# Patient Record
Sex: Male | Born: 1955 | Race: White | Hispanic: No | Marital: Married | Smoking: Never smoker
Health system: Southern US, Community
[De-identification: ages and names within clinical notes are randomized; demographics above are authoritative.]

---

## 2019-01-07 ENCOUNTER — Other Ambulatory Visit: Payer: Self-pay | Admitting: Urology

## 2019-01-07 DIAGNOSIS — C61 Malignant neoplasm of prostate: Secondary | ICD-10-CM

## 2019-03-02 ENCOUNTER — Other Ambulatory Visit: Payer: Self-pay

## 2019-03-02 ENCOUNTER — Ambulatory Visit
Admission: RE | Admit: 2019-03-02 | Discharge: 2019-03-02 | Disposition: A | Discharge status: Home / Self Care | Payer: No Typology Code available for payment source | Source: Ambulatory Visit | Attending: Urology | Admitting: Urology

## 2019-03-02 DIAGNOSIS — C61 Malignant neoplasm of prostate: Secondary | ICD-10-CM

## 2019-03-02 MED ORDER — GADOBENATE DIMEGLUMINE 529 MG/ML IV SOLN
20.0000 mL | Freq: Once | INTRAVENOUS | Status: AC | PRN
  Administered 2019-03-02: 20 mL via INTRAVENOUS

## 2019-03-10 ENCOUNTER — Other Ambulatory Visit: Payer: Self-pay

## 2021-05-29 IMAGING — MR MR PROSTATE WO/W CM
56 series · 56 of 56 positions shown · IV contrast (20 ml multihance)
Comparison: None.

CLINICAL DATA: Recent biopsy demonstrating carcinoma.

EXAM:
MR PROSTATE WITHOUT AND WITH CONTRAST
TECHNIQUE: Multiplanar multisequence MRI images were obtained of the pelvis
centered about the prostate. Pre and post contrast images were
obtained.
CONTRAST:  20mL MULTIHANCE GADOBENATE DIMEGLUMINE 529 MG/ML IV SOLN

[Series 3: T1 · axial · 8.0mm · 1.06mm/px · 1 of 28 slices shown (1 of 2)]
[im 1/28]
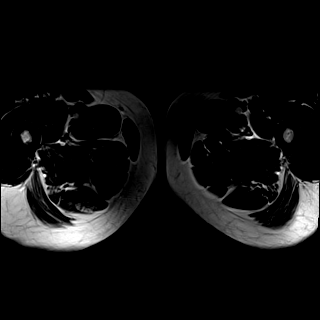

[Series 4: bSSFP fat-sat · axial · 8.0mm · 0.74mm/px · 1 of 28 slices shown]
[im 1/28]
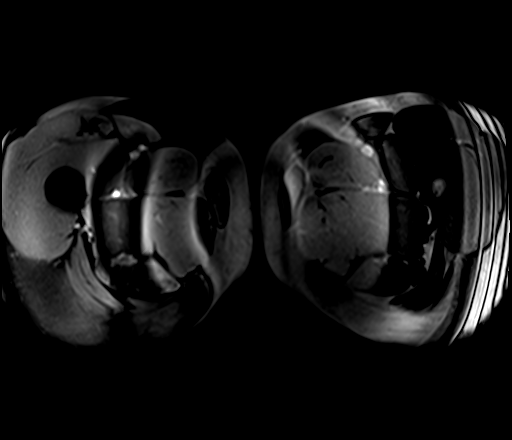

[Series 5: T2 · sagittal · 3.5mm · 0.77mm/px · 1 of 48 slices shown (1 of 4)]
[im 1/48]
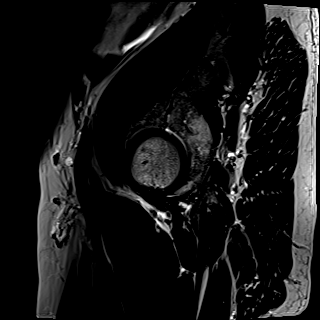

[Series 6: T1 · axial · 3.0mm · 0.31mm/px · 1 of 25 slices shown (2 of 2)]
[im 1/25]
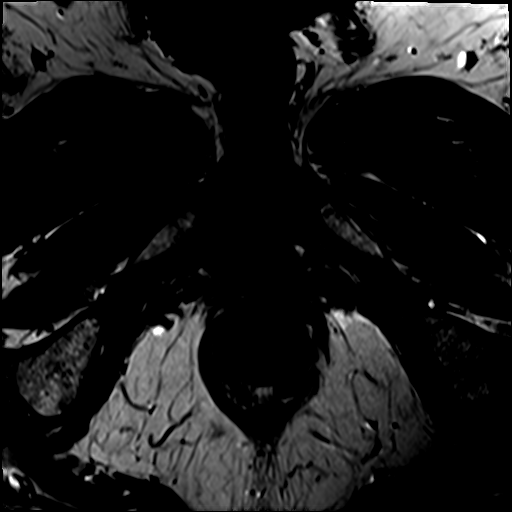

[Series 7: T2 · axial · 3.5mm · 0.56mm/px · 1 of 25 slices shown (2 of 4)]
[im 1/25]
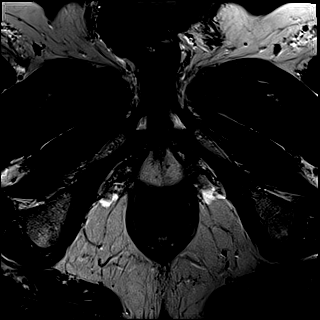

[Series 8: T2 · axial · 1.0mm · 1.04mm/px · 1 of 80 slices shown (3 of 4)]
[im 1/80]
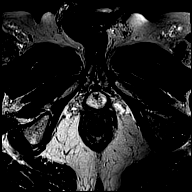

[Series 9: T2 · coronal · 3.5mm · 0.56mm/px · 1 of 23 slices shown (4 of 4)]
[im 1/23]
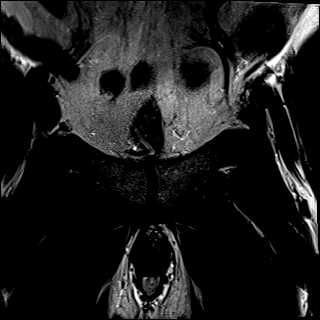

[Series 10: DWI · axial · 3.5mm · 1.56mm/px · 1 of 75 slices shown (1 of 2)]
[im 1/75]
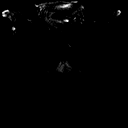

[Series 11: DWI · axial · 3.5mm · 1.56mm/px · 1 of 25 slices shown (2 of 2)]
[im 1/25]
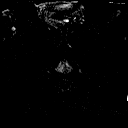

[Series 12: pre t1_twist_tra_dyn_ttc=6.9s · axial · non-contrast · 3.5mm · 0.83mm/px · 1 of 24 slices shown]
[im 1/24]
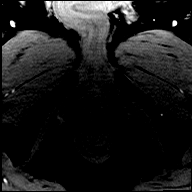

[Series 13: post t1_twist_tra_dyn-copy center · axial · 3.5mm · 0.83mm/px · 1 of 24 slices shown (1 of 24)]
[im 1/24]
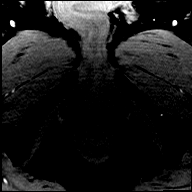

[Series 14: post t1_twist_tra_dyn-copy center · axial · 3.5mm · 0.83mm/px · 1 of 24 slices shown (2 of 24)]
[im 1/24]
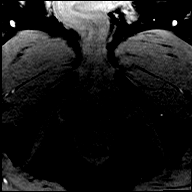

[Series 15: post t1_twist_tra_dyn-copy cent_sub_ttc=(id) · axial · 3.5mm · 0.83mm/px · 1 of 20 slices shown (1 of 22)]
[im 1/20]
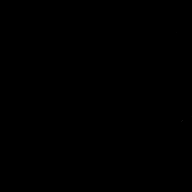

[Series 16: post t1_twist_tra_dyn-copy center · axial · 3.5mm · 0.83mm/px · 1 of 24 slices shown (3 of 24)]
[im 1/24]
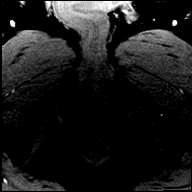

[Series 17: post t1_twist_tra_dyn-copy cent_sub_ttc=(id) · axial · 3.5mm · 0.83mm/px · 1 of 15 slices shown (2 of 22)]
[im 1/15]
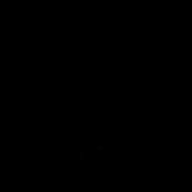

[Series 18: post t1_twist_tra_dyn-copy center · axial · 3.5mm · 0.83mm/px · 1 of 24 slices shown (4 of 24)]
[im 1/24]
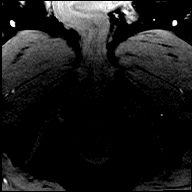

[Series 19: post t1_twist_tra_dyn-copy cent_sub_ttc=(id) · axial · 3.5mm · 0.83mm/px · 1 of 24 slices shown (3 of 22)]
[im 1/24]
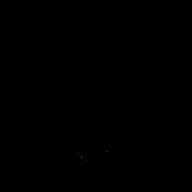

[Series 20: post t1_twist_tra_dyn-copy center · axial · 3.5mm · 0.83mm/px · 1 of 24 slices shown (5 of 24)]
[im 1/24]
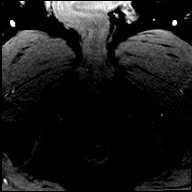

[Series 21: post t1_twist_tra_dyn-copy cent_sub_ttc=(id) · axial · 3.5mm · 0.83mm/px · 1 of 23 slices shown (4 of 22)]
[im 1/23]
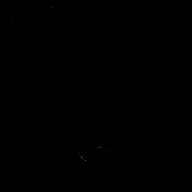

[Series 22: post t1_twist_tra_dyn-copy center · axial · 3.5mm · 0.83mm/px · 1 of 24 slices shown (6 of 24)]
[im 1/24]
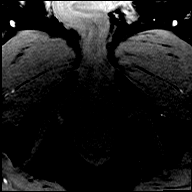

[Series 23: post t1_twist_tra_dyn-copy cent_sub_ttc=(id) · axial · 3.5mm · 0.83mm/px · 1 of 24 slices shown (5 of 22)]
[im 1/24]
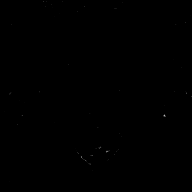

[Series 24: post t1_twist_tra_dyn-copy center · axial · 3.5mm · 0.83mm/px · 1 of 24 slices shown (7 of 24)]
[im 1/24]
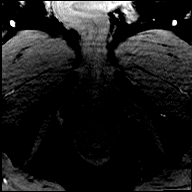

[Series 25: post t1_twist_tra_dyn-copy cent_sub_ttc=(id) · axial · 3.5mm · 0.83mm/px · 1 of 24 slices shown (6 of 22)]
[im 1/24]
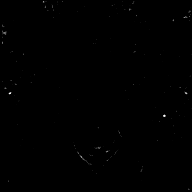

[Series 26: post t1_twist_tra_dyn-copy center · axial · 3.5mm · 0.83mm/px · 1 of 24 slices shown (8 of 24)]
[im 1/24]
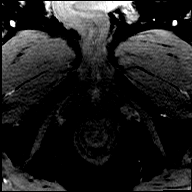

[Series 27: post t1_twist_tra_dyn-copy cent_sub_ttc=(id) · axial · 3.5mm · 0.83mm/px · 1 of 24 slices shown (7 of 22)]
[im 1/24]
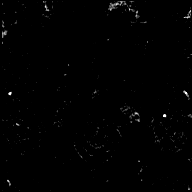

[Series 28: post t1_twist_tra_dyn-copy center · axial · 3.5mm · 0.83mm/px · 1 of 24 slices shown (9 of 24)]
[im 1/24]
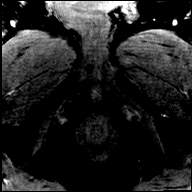

[Series 29: post t1_twist_tra_dyn-copy cent_sub_ttc=(id) · axial · 3.5mm · 0.83mm/px · 1 of 24 slices shown (8 of 22)]
[im 1/24]
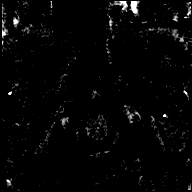

[Series 30: post t1_twist_tra_dyn-copy center · axial · 3.5mm · 0.83mm/px · 1 of 24 slices shown (10 of 24)]
[im 1/24]
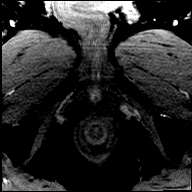

[Series 31: post t1_twist_tra_dyn-copy cent_sub_ttc=(id) · axial · 3.5mm · 0.83mm/px · 1 of 24 slices shown (9 of 22)]
[im 1/24]
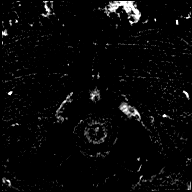

[Series 32: post t1_twist_tra_dyn-copy center · axial · 3.5mm · 0.83mm/px · 1 of 24 slices shown (11 of 24)]
[im 1/24]
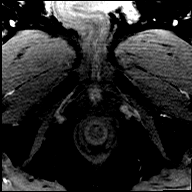

[Series 33: post t1_twist_tra_dyn-copy cent_sub_ttc=(id) · axial · 3.5mm · 0.83mm/px · 1 of 24 slices shown (10 of 22)]
[im 1/24]
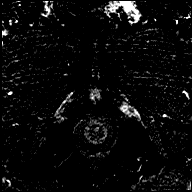

[Series 34: post t1_twist_tra_dyn-copy center · axial · 3.5mm · 0.83mm/px · 1 of 24 slices shown (12 of 24)]
[im 1/24]
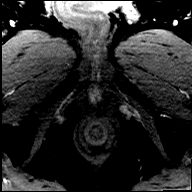

[Series 35: post t1_twist_tra_dyn-copy cent_sub_ttc=(id) · axial · 3.5mm · 0.83mm/px · 1 of 24 slices shown (11 of 22)]
[im 1/24]
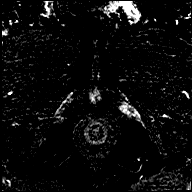

[Series 36: post t1_twist_tra_dyn-copy center · axial · 3.5mm · 0.83mm/px · 1 of 24 slices shown (13 of 24)]
[im 1/24]
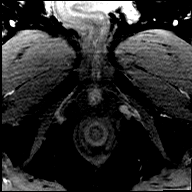

[Series 37: post t1_twist_tra_dyn-copy cent_sub_ttc=(id) · axial · 3.5mm · 0.83mm/px · 1 of 24 slices shown (12 of 22)]
[im 1/24]
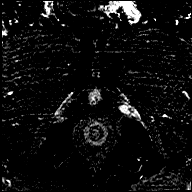

[Series 38: post t1_twist_tra_dyn-copy center · axial · 3.5mm · 0.83mm/px · 1 of 24 slices shown (14 of 24)]
[im 1/24]
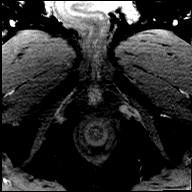

[Series 39: post t1_twist_tra_dyn-copy cent_sub_ttc=(id) · axial · 3.5mm · 0.83mm/px · 1 of 24 slices shown (13 of 22)]
[im 1/24]
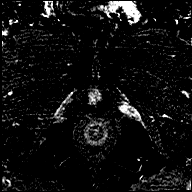

[Series 40: post t1_twist_tra_dyn-copy center · axial · 3.5mm · 0.83mm/px · 1 of 24 slices shown (15 of 24)]
[im 1/24]
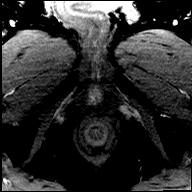

[Series 41: post t1_twist_tra_dyn-copy cent_sub_ttc=(id) · axial · 3.5mm · 0.83mm/px · 1 of 24 slices shown (14 of 22)]
[im 1/24]
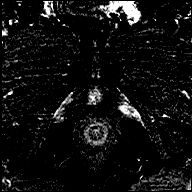

[Series 42: post t1_twist_tra_dyn-copy center · axial · 3.5mm · 0.83mm/px · 1 of 24 slices shown (16 of 24)]
[im 1/24]
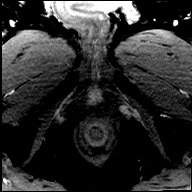

[Series 43: post t1_twist_tra_dyn-copy cent_sub_ttc=(id) · axial · 3.5mm · 0.83mm/px · 1 of 24 slices shown (15 of 22)]
[im 1/24]
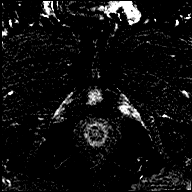

[Series 44: post t1_twist_tra_dyn-copy center · axial · 3.5mm · 0.83mm/px · 1 of 24 slices shown (17 of 24)]
[im 1/24]
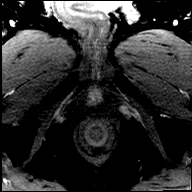

[Series 45: post t1_twist_tra_dyn-copy cent_sub_ttc=(id) · axial · 3.5mm · 0.83mm/px · 1 of 24 slices shown (16 of 22)]
[im 1/24]
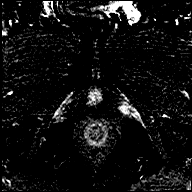

[Series 46: post t1_twist_tra_dyn-copy center · axial · 3.5mm · 0.83mm/px · 1 of 24 slices shown (18 of 24)]
[im 1/24]
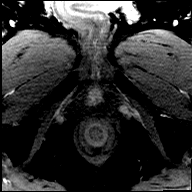

[Series 47: post t1_twist_tra_dyn-copy cent_sub_ttc=(id) · axial · 3.5mm · 0.83mm/px · 1 of 24 slices shown (17 of 22)]
[im 1/24]
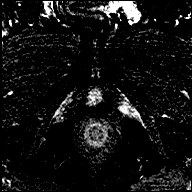

[Series 48: post t1_twist_tra_dyn-copy center · axial · 3.5mm · 0.83mm/px · 1 of 24 slices shown (19 of 24)]
[im 1/24]
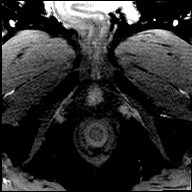

[Series 49: post t1_twist_tra_dyn-copy cent_sub_ttc=(id) · axial · 3.5mm · 0.83mm/px · 1 of 24 slices shown (18 of 22)]
[im 1/24]
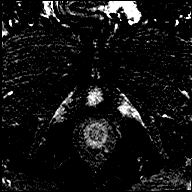

[Series 50: post t1_twist_tra_dyn-copy center · axial · 3.5mm · 0.83mm/px · 1 of 24 slices shown (20 of 24)]
[im 1/24]
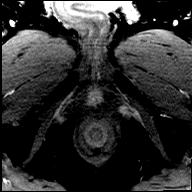

[Series 51: post t1_twist_tra_dyn-copy cent_sub_ttc=(id) · axial · 3.5mm · 0.83mm/px · 1 of 24 slices shown (19 of 22)]
[im 1/24]
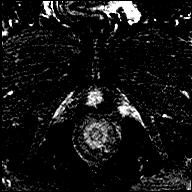

[Series 52: post t1_twist_tra_dyn-copy center · axial · 3.5mm · 0.83mm/px · 1 of 24 slices shown (21 of 24)]
[im 1/24]
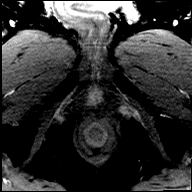

[Series 53: post t1_twist_tra_dyn-copy cent_sub_ttc=(id) · axial · 3.5mm · 0.83mm/px · 1 of 24 slices shown (20 of 22)]
[im 1/24]
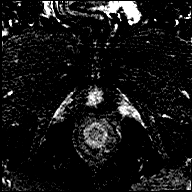

[Series 54: post t1_twist_tra_dyn-copy center · axial · 3.5mm · 0.83mm/px · 1 of 24 slices shown (22 of 24)]
[im 1/24]
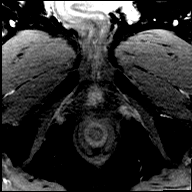

[Series 55: post t1_twist_tra_dyn-copy cent_sub_ttc=(id) · axial · 3.5mm · 0.83mm/px · 1 of 24 slices shown (21 of 22)]
[im 1/24]
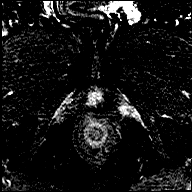

[Series 56: post t1_twist_tra_dyn-copy center · axial · 3.5mm · 0.83mm/px · 1 of 24 slices shown (23 of 24)]
[im 1/24]
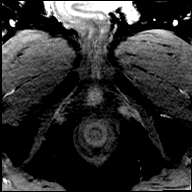

[Series 57: post t1_twist_tra_dyn-copy cent_sub_ttc=(id) · axial · 3.5mm · 0.83mm/px · 1 of 24 slices shown (22 of 22)]
[im 1/24]
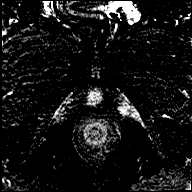

[Series 58: post t1_twist_tra_dyn-copy center · axial · 3.5mm · 0.83mm/px · 1 of 24 slices shown (24 of 24)]
[im 1/24]
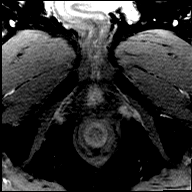

[56 of 56 positions shown; findings below may reference images not displayed]

FINDINGS: Prostate: Demonstrates mild central gland enlargement and
heterogeneity, consistent with benign prostatic hyperplasia. No
dominant central gland nodule.

Within the medial left base, relatively focal T2 hypointensity
measures up to 7 mm on image [DATE]. Corresponds to equivocal
restricted diffusion on image [DATE]. PI-RADS(v2.1)-3.

A tiny focus of lateral left apical T2 hypointensity measures 3 mm
on [DATE]. May correspond to restricted diffusion on [DATE].
PI-RADS(v2.1)-3.

Early post-contrast enhancement is non masslike and relatively
diffuse throughout the peripheral zone, including on series 25.

Volume: 5.0 x 4.0 x 5.3 cm (volume = 56 cm^3)

Transcapsular spread:  Absent

Seminal vesicle involvement: Absent

Neurovascular bundle involvement: Absent

Pelvic adenopathy: Absent

Bone metastasis: Absent

Other findings: No significant free fluid.  Normal urinary bladder.
IMPRESSION: 1. Two foci of T2 hypointensity which may correspond to areas of
restricted diffusion. Cannot exclude areas of low volume
adenocarcinoma. PI-RADS(v2.1)-3. No focal early post-contrast
enhancement. More diffuse peripheral zone post-contrast enhancement
is nonspecific but can be seen with inflammation.
2.  No evidence of locally advanced or pelvic metastatic disease.

## 2022-12-03 ENCOUNTER — Telehealth: Payer: Self-pay | Admitting: Urology

## 2022-12-03 NOTE — Telephone Encounter (Signed)
Patient called in regards to his scheduled appt with Dr Margo Aye on 12/25/2022. Patient states that he is a prior patient of Dr Scharlene Gloss and has continued his care with Dr Sabino Gasser and patient would like LAB work ordered prior to his re establishing care visit on 5/28 so Dr Margo Aye can have those results to go over at the visit. Patient states the main reason he is coming is for Lab work. Patient was told usually lab orders are not put in until seen by the Doctor but I would send a message that he is requesting this beforehand.  Patients callback #: [phone]

## 2022-12-04 ENCOUNTER — Other Ambulatory Visit: Payer: Self-pay | Admitting: Urology

## 2022-12-04 DIAGNOSIS — C61 Malignant neoplasm of prostate: Secondary | ICD-10-CM

## 2022-12-12 LAB — PSA: Prostate Specific Ag, Serum: 1.4 ng/mL (ref 0.0–4.0)

## 2022-12-25 ENCOUNTER — Encounter: Payer: Self-pay | Admitting: Urology

## 2022-12-25 ENCOUNTER — Ambulatory Visit (INDEPENDENT_AMBULATORY_CARE_PROVIDER_SITE_OTHER): Payer: Medicare Other | Admitting: Urology

## 2022-12-25 VITALS — BP 131/83 | HR 87 | Ht 74.0 in | Wt 220.0 lb

## 2022-12-25 DIAGNOSIS — C61 Malignant neoplasm of prostate: Secondary | ICD-10-CM

## 2022-12-25 DIAGNOSIS — N401 Enlarged prostate with lower urinary tract symptoms: Secondary | ICD-10-CM

## 2022-12-25 DIAGNOSIS — N529 Male erectile dysfunction, unspecified: Secondary | ICD-10-CM | POA: Diagnosis not present

## 2022-12-25 MED ORDER — TAMSULOSIN HCL 0.4 MG PO CAPS: 0.4000 mg | ORAL_CAPSULE | Freq: Every day | ORAL | 3 refills | Status: DC

## 2022-12-25 MED ORDER — FINASTERIDE 5 MG PO TABS: 5.0000 mg | ORAL_TABLET | Freq: Every day | ORAL | 3 refills | Status: DC

## 2022-12-25 NOTE — Addendum Note (Signed)
Addended by: Joline Maxcy on: 12/25/2022 09:27 AM   Modules accepted: Orders

## 2022-12-25 NOTE — Progress Notes (Signed)
   Assessment: 1. Benign localized prostatic hyperplasia with lower urinary tract symptoms (LUTS)   2. Prostate cancer (HCC)   3. Erectile dysfunction of organic origin     Plan: Will continue active surveillance-  FU 69mo with psa and prostate mri prior to visit  Continue tam + fin for bph/LUTS  Continue levitra for ED  Chief Complaint: Prostate cancer  HPI: Edwin Hammond is a 67 y.o. male who presents for continued evaluation of a number of urologic issues. These include low risk prostate cancer on active surveillance, BPH/lower urinary tract symptoms on combination medical therapy (tam + fin) as well as erectile dysfunction. Patient last seen by me 04/2020.  He has been seeing Dr. Sabino Gasser since that time.  Today I reviewed his records over the last 3 years which are summarized below---  The patient did not tolerate sildenafil or tadalafil due to side effects.  He was given a prescription for Levitra but states that he has not tried it yet.  Sexual issues or not a big priority for him at this time. He continues to take tamsulosin and finasteride for his BPH/lower urinary tract symptoms. IPSS+ 4  Father was diagnosed with prostate cancer late in his 70s and died of other causes.  No other family history of prostate cancer.  Patient denies other GU complaints.  No other prior GU history.   PRE DX PSA data: 05/2016-2.8 02/2018 -4.2 10/2018  5.12 (22%); 4K score 20   Prostate cancer summary: Initial Dx 10/2018 Pretreatment PSA-5.12 DRE/Clinical Stage:  T1c Prostate volume 72 mL's by ultrasound; 56 mL's by MRI Status post prostate biopsy 10/2018: Pathology Gleason 3+3 =6 involving less than or equal to 5% of cores from right apex, right base, left base, and left lateral base.   Genomic testing: Oncotype DX-very low risk Prostate MRI 01/2019- no PI-RADS 4 or 5 lesions; equivocal focal PI-RADS 3 lesion   Follow-up PSA and Bx data:  (started tam + finasteride) after MRI. 02/2019              3.42 06/2019           2.0 10/2019  1.62 04/2020  1.41  10/2020  1.5 02/2021  1.2  03/2021  repeat TRUS/BX-pathology benign/BPH.  Prostate volume 48 mL  10/2021  1.1 05/2022 1.3 11/2022  1.4   Portions of the above documentation were copied from a prior visit for review purposes only.  Allergies: No Known Allergies  PMH: History reviewed. No pertinent past medical history.  PSH: History reviewed. No pertinent surgical history.  SH:    ROS: Constitutional:  Negative for fever, chills, weight loss CV: Negative for chest pain, previous MI, hypertension Respiratory:  Negative for shortness of breath, wheezing, sleep apnea, frequent cough GI:  Negative for nausea, vomiting, bloody stool, GERD  PE: BP 131/83   Pulse 87   Ht 6\' 2"  (1.88 m)   Wt 220 lb (99.8 kg)   BMI 28.25 kg/m  GENERAL APPEARANCE:  Well appearing, well developed, well nourished, NAD    Results: UA- neg

## 2022-12-27 LAB — URINALYSIS, ROUTINE W REFLEX MICROSCOPIC
Bilirubin, UA: NEGATIVE
Glucose, UA: NEGATIVE
Ketones, UA: NEGATIVE
Leukocytes,UA: NEGATIVE
Nitrite, UA: NEGATIVE
Protein,UA: NEGATIVE
RBC, UA: NEGATIVE
Specific Gravity, UA: 1.015 (ref 1.005–1.030)
Urobilinogen, Ur: 0.2 mg/dL (ref 0.2–1.0)
pH, UA: 7 (ref 5.0–7.5)

## 2023-04-09 ENCOUNTER — Telehealth: Payer: Self-pay

## 2023-04-09 NOTE — Telephone Encounter (Signed)
-----   Message from [facility] Edwin Hammond sent at 12/25/2022  9:13 AM EDT ----- Regarding: Prostate MRI Pt needs a prostate mri in October and see hall in November.

## 2023-04-09 NOTE — Telephone Encounter (Signed)
Left msg that pt needs a f/u appt with Margo Aye after his imaging in November. Office contact info left as well.

## 2023-04-24 ENCOUNTER — Telehealth: Payer: Self-pay | Admitting: Urology

## 2023-04-24 NOTE — Telephone Encounter (Signed)
"  Will continue active surveillance-  FU 13mo with psa and prostate mri prior to visit"  Pt called to schedule this. Called pt back and LVM to have pt call us back to schedule.

## 2023-04-25 ENCOUNTER — Telehealth: Payer: Self-pay | Admitting: Urology

## 2023-04-25 NOTE — Telephone Encounter (Signed)
Pt having an MRI 11/18. I scheduled him for 11/21 to go over the results. Should the results be back by then?

## 2023-06-17 ENCOUNTER — Ambulatory Visit
Admission: RE | Admit: 2023-06-17 | Discharge: 2023-06-17 | Disposition: A | Discharge status: Home / Self Care | Payer: Medicare Other | Source: Ambulatory Visit | Attending: Urology | Admitting: Urology

## 2023-06-17 DIAGNOSIS — C61 Malignant neoplasm of prostate: Secondary | ICD-10-CM

## 2023-06-17 MED ORDER — GADOPICLENOL 0.5 MMOL/ML IV SOLN
10.0000 mL | Freq: Once | INTRAVENOUS | Status: AC | PRN
  Administered 2023-06-17: 10 mL via INTRAVENOUS

## 2023-06-20 ENCOUNTER — Ambulatory Visit: Payer: Medicare Other | Admitting: Urology

## 2023-06-25 ENCOUNTER — Other Ambulatory Visit: Payer: Self-pay

## 2023-06-25 ENCOUNTER — Other Ambulatory Visit: Payer: Medicare Other

## 2023-06-25 DIAGNOSIS — N401 Enlarged prostate with lower urinary tract symptoms: Secondary | ICD-10-CM

## 2023-06-26 LAB — PSA: Prostate Specific Ag, Serum: 1.6 ng/mL (ref 0.0–4.0)

## 2023-07-02 ENCOUNTER — Encounter: Payer: Self-pay | Admitting: Urology

## 2023-07-02 ENCOUNTER — Ambulatory Visit (INDEPENDENT_AMBULATORY_CARE_PROVIDER_SITE_OTHER): Payer: Medicare Other | Admitting: Urology

## 2023-07-02 VITALS — BP 162/75 | HR 64 | Ht 74.0 in | Wt 218.0 lb

## 2023-07-02 DIAGNOSIS — N529 Male erectile dysfunction, unspecified: Secondary | ICD-10-CM

## 2023-07-02 DIAGNOSIS — C61 Malignant neoplasm of prostate: Secondary | ICD-10-CM | POA: Diagnosis not present

## 2023-07-02 DIAGNOSIS — N401 Enlarged prostate with lower urinary tract symptoms: Secondary | ICD-10-CM

## 2023-07-02 LAB — URINALYSIS, ROUTINE W REFLEX MICROSCOPIC
Bilirubin, UA: NEGATIVE
Glucose, UA: NEGATIVE
Ketones, UA: NEGATIVE
Leukocytes,UA: NEGATIVE
Nitrite, UA: NEGATIVE
Protein,UA: NEGATIVE
RBC, UA: NEGATIVE
Specific Gravity, UA: 1.01 (ref 1.005–1.030)
Urobilinogen, Ur: 0.2 mg/dL (ref 0.2–1.0)
pH, UA: 6.5 (ref 5.0–7.5)

## 2023-07-02 NOTE — Progress Notes (Signed)
Assessment: 1. Benign localized prostatic hyperplasia with lower urinary tract symptoms (LUTS)   2. Prostate cancer (HCC)   3. Erectile dysfunction of organic origin       Plan: Will continue active surveillance-  FU 66mo with psa prior to visit   Continue tam + fin for bph/LUTS   Continue levitra for ED  Chief Complaint: Prostate cancer  HPI: Edwin Hammond is a 67 y.o. male who presents for continued evaluation of a number of urologic issues. These include low risk prostate cancer on active surveillance, BPH on combination medical therapy as well as ED.  See my note 12/25/2022 at the time of most recent visit.   The patient did not tolerate sildenafil or tadalafil due to side effects.  He was given a prescription for Levitra but states that he has not tried it yet.  Sexual issues or not a big priority for him at this time. He continues to take tamsulosin and finasteride for his BPH/lower urinary tract symptoms. IPSS=3   Father was diagnosed with prostate cancer late in his 24s and died of other causes.  No other family history of prostate cancer.  Patient denies other GU complaints.  No other prior GU history.   PRE DX PSA data: 05/2016-2.8 02/2018 -4.2 10/2018  5.12 (22%); 4K score 20   Prostate cancer summary: Initial Dx 10/2018 Pretreatment PSA-5.12 DRE/Clinical Stage:  T1c Prostate volume 72 mL's by ultrasound; 56 mL's by MRI Status post prostate biopsy 10/2018: Pathology Gleason 3+3 =6 involving less than or equal to 5% of cores from right apex, right base, left base, and left lateral base.   Genomic testing: Oncotype DX-very low risk Prostate MRI 01/2019- no PI-RADS 4 or 5 lesions; equivocal focal PI-RADS 3 lesion   Follow-up PSA and Bx data:  (started tam + finasteride) after MRI. 02/2019             3.42 06/2019           2.0 10/2019             1.62 04/2020           1.41  10/2020             1.5 02/2021             1.2   03/2021             repeat  TRUS/BX-pathology benign/BPH.  Prostate volume 48 mL   10/2021             1.1 11/20231.3 11/2022             1.4 05/2023 1.6 repeat MRI-no intermediate or high-grade lesions.  Prostate volume     = 37ml  Portions of the above documentation were copied from a prior visit for review purposes only.  Allergies: No Known Allergies  PMH: No past medical history on file.  PSH: No past surgical history on file.  SH: Social History   Tobacco Use   Smoking status: Never   Smokeless tobacco: Never  Substance Use Topics   Alcohol use: Not Currently   Drug use: Never    ROS: Constitutional:  Negative for fever, chills, weight loss CV: Negative for chest pain, previous MI, hypertension Respiratory:  Negative for shortness of breath, wheezing, sleep apnea, frequent cough GI:  Negative for nausea, vomiting, bloody stool, GERD  PE: BP (!) 162/75   Pulse 64   Ht 6\' 2"  (1.88 m)   Wt 218  lb (98.9 kg)   BMI 27.99 kg/m  GENERAL APPEARANCE:  Well appearing, well developed, well nourished, NAD    Results: UA clear

## 2023-12-26 ENCOUNTER — Other Ambulatory Visit: Payer: Medicare Other

## 2023-12-26 DIAGNOSIS — C61 Malignant neoplasm of prostate: Secondary | ICD-10-CM

## 2023-12-27 LAB — PSA: Prostate Specific Ag, Serum: 1.3 ng/mL (ref 0.0–4.0)

## 2024-01-07 ENCOUNTER — Ambulatory Visit (INDEPENDENT_AMBULATORY_CARE_PROVIDER_SITE_OTHER): Payer: Medicare Other | Admitting: Urology

## 2024-01-07 ENCOUNTER — Encounter: Payer: Self-pay | Admitting: Urology

## 2024-01-07 VITALS — BP 147/81 | HR 70 | Ht 74.0 in | Wt 220.0 lb

## 2024-01-07 DIAGNOSIS — N529 Male erectile dysfunction, unspecified: Secondary | ICD-10-CM

## 2024-01-07 DIAGNOSIS — C61 Malignant neoplasm of prostate: Secondary | ICD-10-CM

## 2024-01-07 DIAGNOSIS — N401 Enlarged prostate with lower urinary tract symptoms: Secondary | ICD-10-CM

## 2024-01-07 LAB — URINALYSIS, ROUTINE W REFLEX MICROSCOPIC
Bilirubin, UA: NEGATIVE
Glucose, UA: NEGATIVE
Ketones, UA: NEGATIVE
Leukocytes,UA: NEGATIVE
Nitrite, UA: NEGATIVE
Protein,UA: NEGATIVE
RBC, UA: NEGATIVE
Specific Gravity, UA: 1.015 (ref 1.005–1.030)
Urobilinogen, Ur: 0.2 mg/dL (ref 0.2–1.0)
pH, UA: 7 (ref 5.0–7.5)

## 2024-01-07 NOTE — Progress Notes (Signed)
 Assessment: 1. Prostate cancer (HCC); low risk; on active surveillance since 2020   2. Benign localized prostatic hyperplasia with lower urinary tract symptoms (LUTS)   3. Erectile dysfunction of organic origin     Plan: I personally reviewed the patient's chart including provider notes, lab and imaging results. Continue active surveillance for low risk prostate cancer. Continue tamsulosin  and finasteride   Continue levitra for ED Return to office in 6 months with PSA  Chief Complaint: Chief Complaint  Patient presents with   Prostate Cancer    HPI: Edwin Hammond is a 68 y.o. male who presents for continued evaluation of prostate cancer, BPH, and ED. He was previously followed by Dr. Del Favia with his last visit in December 2024.  ED: The patient did not tolerate sildenafil or tadalafil due to side effects.  He was given a prescription for Levitra but had not tried it at the time of his visit in December 2024.  Sexual issues or not a big priority for him at this time. He has tried Levitra with some success.  BPH with LUTS: He continues to take tamsulosin  and finasteride  for his BPH/lower urinary tract symptoms. His symptoms are stable and well-controlled.  No dysuria or gross hematuria. IPSS = 6/2     Prostate cancer summary: PRE DX PSA data: 05/2016-2.8 02/2018 -4.2 10/2018  5.12 (22%); 4K score 20  Initial Dx 10/2018: Pretreatment PSA-5.12 DRE/Clinical Stage:  T1c Prostate volume 72 mL's by ultrasound; 56 mL's by MRI Status post prostate biopsy 10/2018: Pathology Gleason 3+3 =6 involving less than or equal to 5% of cores from right apex, right base, left base, and left lateral base.   Genomic testing: Oncotype DX-very low risk Prostate MRI 01/2019- no PI-RADS 4 or 5 lesions; equivocal focal PI-RADS 3 lesion   Follow-up PSA and Bx data:  (started tam + finasteride ) after MRI. 02/2019             3.42 06/2019           2.0 10/2019             1.62 04/2020            1.41  10/2020             1.5 02/2021             1.2   03/2021             repeat TRUS/BX-pathology benign/BPH.  Prostate volume 48 mL   10/2021             1.1 05/2022  1.3 11/2022             1.4 05/2023  1.6       repeat MRI-no intermediate or high-grade lesions.  Prostate volume = 37 ml 11/2023   1.3  Portions of the above documentation were copied from a prior visit for review purposes only.  Allergies: No Known Allergies  PMH: No past medical history on file.  PSH: No past surgical history on file.  SH: Social History   Tobacco Use   Smoking status: Never   Smokeless tobacco: Never  Substance Use Topics   Alcohol use: Not Currently   Drug use: Never    ROS: Constitutional:  Negative for fever, chills, weight loss CV: Negative for chest pain, previous MI, hypertension Respiratory:  Negative for shortness of breath, wheezing, sleep apnea, frequent cough GI:  Negative for nausea, vomiting, bloody stool, GERD  PE: BP (!) 147/81  Pulse 70   Ht 6\' 2"  (1.88 m)   Wt 220 lb (99.8 kg)   BMI 28.25 kg/m  GENERAL APPEARANCE:  Well appearing, well developed, well nourished, NAD HEENT:  Atraumatic, normocephalic, oropharynx clear NECK:  Supple without lymphadenopathy or thyromegaly ABDOMEN:  Soft, non-tender, no masses EXTREMITIES:  Moves all extremities well, without clubbing, cyanosis, or edema NEUROLOGIC:  Alert and oriented x 3, normal gait, CN II-XII grossly intact MENTAL STATUS:  appropriate BACK:  Non-tender to palpation, No CVAT SKIN:  Warm, dry, and intact   Results: U/A: Negative

## 2024-02-19 ENCOUNTER — Other Ambulatory Visit: Payer: Self-pay | Admitting: Urology

## 2024-02-25 ENCOUNTER — Other Ambulatory Visit: Payer: Self-pay | Admitting: Urology

## 2024-02-25 ENCOUNTER — Telehealth: Payer: Self-pay | Admitting: Urology

## 2024-02-25 MED ORDER — TAMSULOSIN HCL 0.4 MG PO CAPS: 0.4000 mg | ORAL_CAPSULE | Freq: Every day | ORAL | 3 refills | Status: DC

## 2024-02-25 NOTE — Telephone Encounter (Signed)
Patient requesting refill on Tamsulosin

## 2024-03-02 ENCOUNTER — Other Ambulatory Visit: Payer: Self-pay

## 2024-03-02 MED ORDER — FINASTERIDE 5 MG PO TABS: 5.0000 mg | ORAL_TABLET | Freq: Every day | ORAL | 1 refills | Status: DC

## 2024-03-02 NOTE — Telephone Encounter (Signed)
 Pt has f/u appt on file and has been taking medication consistently. Medication refilled until next appt.

## 2024-06-30 ENCOUNTER — Other Ambulatory Visit: Payer: Self-pay

## 2024-06-30 DIAGNOSIS — C61 Malignant neoplasm of prostate: Secondary | ICD-10-CM

## 2024-07-01 ENCOUNTER — Other Ambulatory Visit

## 2024-07-01 DIAGNOSIS — C61 Malignant neoplasm of prostate: Secondary | ICD-10-CM

## 2024-07-02 ENCOUNTER — Ambulatory Visit: Payer: Self-pay | Admitting: Urology

## 2024-07-02 LAB — PSA: Prostate Specific Ag, Serum: 1.7 ng/mL (ref 0.0–4.0)

## 2024-07-08 ENCOUNTER — Ambulatory Visit: Admitting: Urology

## 2024-08-18 ENCOUNTER — Ambulatory Visit: Admitting: Urology

## 2024-08-18 ENCOUNTER — Encounter: Payer: Self-pay | Admitting: Urology

## 2024-08-18 VITALS — BP 123/81 | HR 78 | Ht 73.0 in | Wt 219.0 lb

## 2024-08-18 DIAGNOSIS — N529 Male erectile dysfunction, unspecified: Secondary | ICD-10-CM | POA: Insufficient documentation

## 2024-08-18 DIAGNOSIS — N401 Enlarged prostate with lower urinary tract symptoms: Secondary | ICD-10-CM | POA: Insufficient documentation

## 2024-08-18 DIAGNOSIS — C61 Malignant neoplasm of prostate: Secondary | ICD-10-CM | POA: Diagnosis not present

## 2024-08-18 LAB — MICROSCOPIC EXAMINATION

## 2024-08-18 LAB — URINALYSIS, ROUTINE W REFLEX MICROSCOPIC
Bilirubin, UA: NEGATIVE
Glucose, UA: NEGATIVE
Leukocytes,UA: NEGATIVE
Nitrite, UA: NEGATIVE
Protein,UA: NEGATIVE
RBC, UA: NEGATIVE
Specific Gravity, UA: 1.02 (ref 1.005–1.030)
Urobilinogen, Ur: 0.2 mg/dL (ref 0.2–1.0)
pH, UA: 5.5 (ref 5.0–7.5)

## 2024-08-18 MED ORDER — FINASTERIDE 5 MG PO TABS: 5.0000 mg | ORAL_TABLET | Freq: Every day | ORAL | 3 refills | Status: AC

## 2024-08-18 MED ORDER — TAMSULOSIN HCL 0.4 MG PO CAPS: 0.4000 mg | ORAL_CAPSULE | Freq: Every day | ORAL | 3 refills | Status: AC

## 2024-08-18 NOTE — Progress Notes (Signed)
 "  Assessment: 1. Prostate cancer (HCC); low risk; on active surveillance since 2020   2. Benign localized prostatic hyperplasia with lower urinary tract symptoms (LUTS)   3. Erectile dysfunction of organic origin     Plan: Continue active surveillance for low risk prostate cancer. I reviewed the role of active surveillance for management of low risk and favorable intermediate risk prostate cancer. The advantages of active surveillance were discussed including the following:  - Somewhere between 50% and 68% of eligible patients may safely avoid treatment for at least 10 years - Patient's will avoid possible side effects of definitive therapy that may be unnecessary - Quality of life/normal activities will be less affected while on active surveillance - Risk of unnecessary treatment of small, indolent cancers will be reduced  Limitations of active surveillance discussed including: -Between 32% and 50% of patients will require treatment by 10 years, although treatment delays do not seem to impact cure rate -Although the risk is very low (<0.5% according to most published series), it is possible for cancers progressed to a regional or metastatic stage while on active surveillance  I also discussed that patients who choose active surveillance should have regular follow-up including PSA testing approximately every 6 months, DRE annually, confirmatory biopsy approximately 1 year after initial diagnosis, multiparametric MRI every 12 months or as clinically indicated.  Schedule for mp MRI of prostate Continue tamsulosin  and finasteride  - refills sent. Return to office in 6 months with PSA  Chief Complaint: Chief Complaint  Patient presents with   Prostate Cancer    HPI: Edwin Hammond is a 69 y.o. male who presents for continued evaluation of low risk prostate cancer, BPH, and ED. He was previously followed by Dr. Shona.  ED: The patient did not tolerate sildenafil or tadalafil due to  side effects.  He was given a prescription for Levitra but had not tried it at the time of his visit in December 2024.  Sexual issues or not a big priority for him at this time. He has tried Levitra with some success. He is not currently using Levitra.  His wife is having some health issues so he is not concerned about erectile function at this time.  BPH with LUTS: At his visit in June 2025, he continued to take tamsulosin  and finasteride  for his BPH/lower urinary tract symptoms. His symptoms were stable and well-controlled.  No dysuria or gross hematuria. IPSS = 6/2 He continues on tamsulosin  and finasteride .  His lower urinary tract symptoms are well-controlled.  No dysuria or gross hematuria. IPSS = 3/1.    Prostate cancer summary: PRE DX PSA data: 05/2016-2.8 02/2018 -4.2 10/2018  5.12 (22%); 4K score 20  Initial Dx 10/2018: Pretreatment PSA-5.12 DRE/Clinical Stage:  T1c Prostate volume 72 mL's by ultrasound; 56 mL's by MRI Status post prostate biopsy 10/2018: Pathology Gleason 3+3 =6 involving less than or equal to 5% of cores from right apex, right base, left base, and left lateral base.   Genomic testing: Oncotype DX-very low risk Prostate MRI 01/2019- no PI-RADS 4 or 5 lesions; equivocal focal PI-RADS 3 lesion   Follow-up PSA and Bx data:  (started tam + finasteride ) after MRI. 02/2019             3.42 06/2019           2.0 10/2019             1.62 04/2020           1.41  10/2020  1.5 02/2021             1.2   03/2021             repeat TRUS/BX-pathology benign/BPH.  Prostate volume 48 mL   10/2021             1.1 05/2022  1.3 11/2022             1.4 05/2023  1.6       repeat MRI-no intermediate or high-grade lesions.  Prostate volume = 37 ml 11/2023   1.3 06/2024  1.7   Portions of the above documentation were copied from a prior visit for review purposes only.  Allergies: No Known Allergies  PMH: No past medical history on file.  PSH: No past surgical  history on file.  SH: Social History   Tobacco Use   Smoking status: Never   Smokeless tobacco: Never  Substance Use Topics   Alcohol use: Not Currently   Drug use: Never    ROS: Constitutional:  Negative for fever, chills, weight loss CV: Negative for chest pain, previous MI, hypertension Respiratory:  Negative for shortness of breath, wheezing, sleep apnea, frequent cough GI:  Negative for nausea, vomiting, bloody stool, GERD  PE: BP 123/81   Pulse 78   Ht 6' 1 (1.854 m)   Wt 219 lb (99.3 kg)   BMI 28.89 kg/m  GENERAL APPEARANCE:  Well appearing, well developed, well nourished, NAD HEENT:  Atraumatic, normocephalic, oropharynx clear NECK:  Supple without lymphadenopathy or thyromegaly ABDOMEN:  Soft, non-tender, no masses EXTREMITIES:  Moves all extremities well, without clubbing, cyanosis, or edema NEUROLOGIC:  Alert and oriented x 3, normal gait, CN II-XII grossly intact MENTAL STATUS:  appropriate BACK:  Non-tender to palpation, No CVAT SKIN:  Warm, dry, and intact GU: Prostate: 40 g, NT, no nodules Rectum: Normal tone,  no masses or tenderness    Results: U/A: 0-5 WBCs, 0-2 RBCs "

## 2024-09-08 ENCOUNTER — Ambulatory Visit (HOSPITAL_BASED_OUTPATIENT_CLINIC_OR_DEPARTMENT_OTHER)

## 2025-02-10 ENCOUNTER — Other Ambulatory Visit

## 2025-02-18 ENCOUNTER — Ambulatory Visit: Admitting: Urology
